# Patient Record
Sex: Female | Born: 1940 | Race: White | Hispanic: No | Marital: Married | Smoking: Never smoker
Health system: Southern US, Community
[De-identification: ages and names within clinical notes are randomized; demographics above are authoritative.]

## PROBLEM LIST (undated history)

## (undated) DIAGNOSIS — C541 Malignant neoplasm of endometrium: Secondary | ICD-10-CM

## (undated) DIAGNOSIS — E785 Hyperlipidemia, unspecified: Secondary | ICD-10-CM

## (undated) DIAGNOSIS — I10 Essential (primary) hypertension: Secondary | ICD-10-CM

## (undated) HISTORY — DX: Malignant neoplasm of endometrium: C54.1

## (undated) HISTORY — DX: Essential (primary) hypertension: I10

## (undated) HISTORY — DX: Hyperlipidemia, unspecified: E78.5

---

## 2008-04-10 HISTORY — PX: CHOLECYSTECTOMY: SHX55

## 2010-06-21 HISTORY — PX: ENDOMETRIAL BIOPSY: SHX622

## 2010-07-14 ENCOUNTER — Ambulatory Visit: Payer: Medicare Other | Attending: Gynecologic Oncology | Admitting: Gynecologic Oncology

## 2010-07-14 DIAGNOSIS — C549 Malignant neoplasm of corpus uteri, unspecified: Secondary | ICD-10-CM | POA: Insufficient documentation

## 2010-07-14 DIAGNOSIS — I10 Essential (primary) hypertension: Secondary | ICD-10-CM | POA: Insufficient documentation

## 2010-07-14 DIAGNOSIS — Z79899 Other long term (current) drug therapy: Secondary | ICD-10-CM | POA: Insufficient documentation

## 2010-07-14 DIAGNOSIS — Z809 Family history of malignant neoplasm, unspecified: Secondary | ICD-10-CM | POA: Insufficient documentation

## 2010-07-14 DIAGNOSIS — E785 Hyperlipidemia, unspecified: Secondary | ICD-10-CM | POA: Insufficient documentation

## 2010-07-19 NOTE — Consult Note (Signed)
[name], [name]                 ACCOUNT NO.:  [id number]  MEDICAL RECORD NO.:  [id number]           PATIENT TYPE:  LOCATION:                                 FACILITY:  PHYSICIAN:  Laurette Schimke, MD          DATE OF BIRTH:  DATE OF CONSULTATION:  07/14/2010 DATE OF DISCHARGE:                                CONSULTATION   Visit #[id number].  REFERRING PHYSICIAN:  Dr. Thamas Jaegers  REASON FOR VISIT:  Management of a grade 1 endometrial cancer.  HISTORY OF PRESENT ILLNESS:  This is a 70 year old gravida 4, para 3, last normal menstrual period at the age of 32.  She reports vaginal bleeding little over 3 weeks ago.  She was evaluated and an endometrial biopsy was collected on June 21, 2010, which demonstrated a well- differentiated endometrial adenocarcinoma, endometrioid type.  She states that since the procedure she has had continued bleeding.  PAST MEDICAL HISTORY: 1. Hypertension for 3 years. 2. Hyperlipidemia.  PAST SURGICAL HISTORY:  Cholecystectomy 2 years ago.  GYNECOLOGIC HISTORY:  Regular menses, never used any agents for birth control.  No history of abnormal Pap test, last Pap test in March 2012 within normal limits.  FAMILY HISTORY:  Notable for sister with an unknown malignancy who was diagnosed in her 75s, with death within a few weeks.  SCREENING HISTORY:  Colonoscopy 3 years ago within normal limits. Mammogram 2 weeks ago within normal limits.  SOCIAL HISTORY:  She denies tobacco use.  She reports occasional alcohol use.  Her children are alive and well.  She has been married for 55 years.  Her husband was recently diagnosed with early stage colon cancer.  REVIEW OF SYSTEMS:  Vaginal bleeding.  No hematuria.  No hematochezia. Bilateral intermittent pelvic pain, constipation.  No fever, chills, chest pain, shortness of breath, or weight loss.  ALLERGIES:  No known drug allergies.  MEDICATIONS: 1. Alprazolam 0.5 mg daily. 2. Lisinopril and  hydrochlorothiazide 20/12.5 mg daily. 3. Aspirin 81 mg daily. 4. Lactulose 10 mg daily. 5. Calcium 600 mg daily. 6. Pravastatin 20 mg daily.  PHYSICAL EXAMINATION:  GENERAL:  Well-developed, pleasant female, in no acute distress. VITAL SIGNS:  Weight 197 pounds, height 5 feet 3 inches, blood pressure 120/60, pulse 68, BMI of 35. CHEST:  Clear to auscultation. HEART:  Regular rate and rhythm. ABDOMEN:  Soft, obese, nontender.  No palpable masses. BACK:  No CVA tenderness. LYMPH NODE SURVEY:  No cervical, supraclavicular, or inguinal adenopathy. EXTREMITIES:  No clubbing, cyanosis, or edema. PELVIC:  Normal external genitalia, Bartholin, urethra, and Skene. Blood present in the vaginal vault, small cervix roughly 2.5 cm, no nodularity, no parametrial infiltration. RECTAL:  Good anal sphincter tone without any masses.  IMPRESSION:  [name] is a 70 year old with of a grade 1 endometrial adenocarcinoma of recent diagnosis.  The options discussed with the patient were that of either minimally invasive or exploratory laparotomy for endometrial cancer staging.  She was also made aware of other interventions that could abate symptoms, but warrant definitive treatment management.  The patient opted for a minimally invasive  approach for management of her endometrial cancer.  The proposed procedure is scheduled for Aug 29, 2009.  At that time, there is proposed robotic hysterectomy, bilateral salpingo-oophorectomy, pelvic lymph node dissection.  Risks and benefits of the procedure were discussed with the patient and her husband and daughter.  All their questions were answered to their satisfaction.  She was advised to discontinue the low-dose aspirin 10 days prior to surgery.     Laurette Schimke, MD     WB/MEDQ  D:  07/14/2010  T:  07/15/2010  Job:  [id number]  cc:   Dr. Jolene Schimke, R.N. [address] [place], [place] [address]  Dr. Renae Fickle  Electronically Signed  by Laurette Schimke MD on 07/19/2010 07:08:42 AM

## 2010-07-25 ENCOUNTER — Other Ambulatory Visit: Payer: Self-pay | Admitting: Gynecologic Oncology

## 2010-08-05 ENCOUNTER — Other Ambulatory Visit: Payer: Self-pay | Admitting: Obstetrics & Gynecology

## 2010-08-05 ENCOUNTER — Other Ambulatory Visit: Payer: Self-pay | Admitting: Gynecologic Oncology

## 2010-08-05 ENCOUNTER — Ambulatory Visit (HOSPITAL_COMMUNITY)
Admission: RE | Admit: 2010-08-05 | Discharge: 2010-08-05 | Disposition: A | Discharge status: Home / Self Care | Payer: Medicare Other | Source: Ambulatory Visit | Attending: Obstetrics & Gynecology | Admitting: Obstetrics & Gynecology

## 2010-08-05 ENCOUNTER — Encounter (HOSPITAL_COMMUNITY): Payer: Medicare Other

## 2010-08-05 DIAGNOSIS — I10 Essential (primary) hypertension: Secondary | ICD-10-CM | POA: Insufficient documentation

## 2010-08-05 DIAGNOSIS — Z90710 Acquired absence of both cervix and uterus: Secondary | ICD-10-CM | POA: Insufficient documentation

## 2010-08-05 DIAGNOSIS — Z79899 Other long term (current) drug therapy: Secondary | ICD-10-CM | POA: Insufficient documentation

## 2010-08-05 DIAGNOSIS — C541 Malignant neoplasm of endometrium: Secondary | ICD-10-CM

## 2010-08-05 DIAGNOSIS — Z01818 Encounter for other preprocedural examination: Secondary | ICD-10-CM | POA: Insufficient documentation

## 2010-08-05 DIAGNOSIS — Z01810 Encounter for preprocedural cardiovascular examination: Secondary | ICD-10-CM | POA: Insufficient documentation

## 2010-08-05 DIAGNOSIS — Z01812 Encounter for preprocedural laboratory examination: Secondary | ICD-10-CM | POA: Insufficient documentation

## 2010-08-05 DIAGNOSIS — C549 Malignant neoplasm of corpus uteri, unspecified: Secondary | ICD-10-CM | POA: Insufficient documentation

## 2010-08-05 LAB — COMPREHENSIVE METABOLIC PANEL
ALT: 17 U/L (ref 0–35)
AST: 19 U/L (ref 0–37)
Albumin: 3.6 g/dL (ref 3.5–5.2)
Calcium: 9.3 mg/dL (ref 8.4–10.5)
Chloride: 107 mEq/L (ref 96–112)
Creatinine, Ser: 1.22 mg/dL — ABNORMAL HIGH (ref 0.4–1.2)
GFR calc Af Amer: 53 mL/min — ABNORMAL LOW (ref >60)
Sodium: 140 mEq/L (ref 135–145)
Total Bilirubin: 0.6 mg/dL (ref 0.3–1.2)

## 2010-08-05 LAB — DIFFERENTIAL
Basophils Absolute: 0 10*3/uL (ref 0.0–0.1)
Basophils Relative: 0 % (ref 0–1)
Eosinophils Absolute: 0.3 10*3/uL (ref 0.0–0.7)
Monocytes Relative: 9 % (ref 3–12)
Neutro Abs: 3.4 10*3/uL (ref 1.7–7.7)
Neutrophils Relative %: 59 % (ref 43–77)

## 2010-08-05 LAB — CBC
MCH: 29.3 pg (ref 26.0–34.0)
Platelets: 230 10*3/uL (ref 150–400)
RBC: 4.41 MIL/uL (ref 3.87–5.11)

## 2010-08-09 DIAGNOSIS — C541 Malignant neoplasm of endometrium: Secondary | ICD-10-CM

## 2010-08-09 HISTORY — DX: Malignant neoplasm of endometrium: C54.1

## 2010-08-09 HISTORY — PX: OTHER SURGICAL HISTORY: SHX169

## 2010-08-16 ENCOUNTER — Ambulatory Visit (HOSPITAL_COMMUNITY)
Admission: RE | Admit: 2010-08-16 | Discharge: 2010-08-17 | Disposition: A | Discharge status: Home / Self Care | Payer: Medicare Other | Source: Ambulatory Visit | Attending: Obstetrics & Gynecology | Admitting: Obstetrics & Gynecology

## 2010-08-16 ENCOUNTER — Other Ambulatory Visit: Payer: Self-pay | Admitting: Gynecologic Oncology

## 2010-08-16 DIAGNOSIS — Z79899 Other long term (current) drug therapy: Secondary | ICD-10-CM | POA: Insufficient documentation

## 2010-08-16 DIAGNOSIS — C549 Malignant neoplasm of corpus uteri, unspecified: Secondary | ICD-10-CM | POA: Insufficient documentation

## 2010-08-16 DIAGNOSIS — Z01812 Encounter for preprocedural laboratory examination: Secondary | ICD-10-CM | POA: Insufficient documentation

## 2010-08-16 DIAGNOSIS — R7309 Other abnormal glucose: Secondary | ICD-10-CM | POA: Insufficient documentation

## 2010-08-16 DIAGNOSIS — I10 Essential (primary) hypertension: Secondary | ICD-10-CM | POA: Insufficient documentation

## 2010-08-16 HISTORY — PX: ABDOMINAL HYSTERECTOMY: SHX81

## 2010-08-16 LAB — ABO/RH: ABO/RH(D): O POS

## 2010-08-16 LAB — TYPE AND SCREEN

## 2010-08-17 LAB — BASIC METABOLIC PANEL
CO2: 24 mEq/L (ref 19–32)
Calcium: 8.7 mg/dL (ref 8.4–10.5)
GFR calc Af Amer: >60 mL/min (ref >60)
Sodium: 134 mEq/L — ABNORMAL LOW (ref 135–145)

## 2010-08-17 LAB — CBC
Hemoglobin: 10.3 g/dL — ABNORMAL LOW (ref 12.0–15.0)
MCHC: 33.3 g/dL (ref 30.0–36.0)
RBC: 3.51 MIL/uL — ABNORMAL LOW (ref 3.87–5.11)

## 2010-08-17 LAB — HEMOGLOBIN A1C: Mean Plasma Glucose: 111 mg/dL (ref <117)

## 2010-08-25 NOTE — Op Note (Signed)
[name], [name]                 ACCOUNT NO.:  [id number]  MEDICAL RECORD NO.:  [id number]           PATIENT TYPE:  O  LOCATION:  1531                         FACILITY:  [facility]  PHYSICIAN:  Nikiah Goin A. Duard Brady, MD    DATE OF BIRTH:  Aug 01, 1940  DATE OF PROCEDURE:  08/16/2010 DATE OF DISCHARGE:                              OPERATIVE REPORT   PREOPERATIVE DIAGNOSIS:  Grade 1 endometrioid adenocarcinoma.  POSTOPERATIVE DIAGNOSIS:  Grade 1 endometrioid adenocarcinoma.  PROCEDURE:  Total robotic hysterectomy, bilateral salpingo-oophorectomy, bilateral pelvic right para-aortic lymph node dissection.  SURGEON: 1. Andreika Vandagriff A. Duard Brady, MD 2. Roseanna [place], M.D.  ASSISTANT:  Telford Nab, R.N.  ANESTHESIA:  General.  ANESTHESIOLOGIST:  Jill Side, M.D.  BLOOD LOSS:  50 mL.  IV FLUIDS:  1500 mL.  URINE OUTPUT:  200 mL.  SPECIMENS:  Washings, cervix, uterus, bilateral tubes and ovaries, bilateral pelvic lymph nodes, right-sided para-aortic lymph nodes to pathology.  COMPLICATIONS:  None.  OPERATIVE FINDINGS:  Included a globular uterus approximately 8-week sized.  Bilaterally there were enlarged right external iliac and left external iliac nodes.  Frozen section showed a grade 1 lesion with greater than 50% myometrial invasion.  DESCRIPTION OF PROCEDURE:  The patient was identified in holding area as self.  Informed consent was signed on the chart.  Risks and benefits of the procedure were discussed with the patient and she wished to proceed. She was then taken to the operating room, placed in supine position where arms were tucked at her sides with all appropriate precautions and Gelfoam.  General anesthesia was then induced.  She was then placed in dorsal lithotomy position with SCDs.  Shoulder blocks were placed in appropriate fashion.  OG tube was placed and placed to suction.  The perineum was cleansed with Betadine cleaning solution.  Time-out  was performed to confirm the patient, the procedure, antibiotic, and allergy status.  Foley catheter was inserted with the bladder.  Sterile speculum was inserted into the vagina.  The cervix was grasped with single-tooth tenaculum.  The endocervical canal was dilated.  The ZUMI with the medium ring was placed without difficulty.  The abdomen was then prepped in usual fashion with ChloraPrep.  After waiting for dry 3 minutes, the patient was then draped.  Time-out was performed again to confirm the procedure, the patient, antibiotic and allergy status.  After again confirming that the OG tube was placed into suction.  A 1 cc of local 0.25% Marcaine was injected in the midclavicular line 2 cm below the costal margin on the left hand side.  Incision was made using the 5-mm Optiview.  Intra-abdominal placement was confirmed with the port.  The abdomen was insufflated with CO2 gas.  At this point and all points during the case, the patient's intra-abdominal pressure did not increase over 50 mmHg.  She was then placed in deep Trendelenburg position which she tolerated quite well.  A 10/12 port was placed 23 cm above the pubic symphysis.  Bilateral 8-mm ports were placed at 10 cm width at a 50- degree angle inferior to this under direct visualization.  The 5-mm port was then under direct visualization converted to a 10/12.  The small bowel was folded upon itself on the mesentery.  The robot was undocked. Abdominal pelvic washings were obtained.  The round ligament on the right side was transected with monopolar cautery and the anterior and posterior leaves of broad ligament were opened.  The ureter was identified.  A window was made between the IP and the ureter.  The IP was coagulated and transected.  The uterine vessels were then skeletonized and the bladder flap was created.  The uterine vessels were then coagulated with bipolar cautery and transected and a C-loop was created.  Similar  procedure was performed on the patient's left side after confirming that the bladder flap was adequate.  The pneumo- occluder balloon was insufflated.  The colpotomy was performed.  The specimen was delivered through the vagina without difficulty.  The pneumo-occluder balloon was replaced.  Attention was then drawn to the right pelvic sidewall.  The paravesical and pararectal spaces were opened.  A cystic grasper was used to hold the pararectal space, opened in the nodal bundle, was taken down from the inferior aspect of the common iliac artery on the patient's right side down to the circumflex iliac vein.  The paravesical space was then held open.  The obturator nerve was identified.  The nodal bundle superior to the obturator nerve was taken down.  The genitofemoral and obturator nerves were visualized at all times and spared.  The ureter was well medial from the ovary dissection.  The area was hemostatic. The right pelvic lymph nodes were then placed in EndoCatch bag with no knot.  Similar procedure was performed on the patient's left side.  The only difference was that there was an enlarged approximately 2 x 2 cm lymph node that was splaying the external iliac artery and vein on the left side from each other.  Similarly, this nodal bundle was placed in EndoCatch bag with 2 knots.  Our attention was then drawn to the para- aortic region.  Using monopolar cautery, an incision was made over the common iliac artery on the patient's right side.  The ureter was identified and held away.  The nodal bundle extending to the right side of the aorta over the vena cava was taken down with great care using pinpoint cautery.  This dissection was performed to the reflection of the duodenum.  These nodal bundle was then delivered through the 10/12 port.  Due to the patient's habitus and intra-abdominal obesity, we could not perform the left-sided para-aortic lymph nodes.  All specimens included  Ray-Tec and 2 EndoCatch bags with nodes were delivered through the vagina.  The pneumo-occluder balloon was replaced.  The vagina was closed with a running suture of 0 Vicryl on a CT-1 in a running fashion. The needle was removed without difficulty.  The abdomen and pelvis were copiously irrigated.  All pedicles were removed, hemostatic under low flow.  The trocars were then all removed as were the instruments under direct visualization.  The robot was then undocked.  The pneumoperitoneum was removed.  The fascia was closed in a figure-of- eight fashion at the umbilicus.  A deep stitch was placed in 10/12 port in the left upper quadrant.  Skin was closed using 4-0 Vicryl.  Steri-Strips and benzoin were applied.  The shoulder blocks were removed, the skin was atraumatic.  The vagina was swabbed and noted be hemostatic.  The patient tolerated the procedure well.  All instrument, needle,  and Ray-Tec counts were correct x2.     Faiza Bansal A. Duard Brady, MD     PAG/MEDQ  D:  08/16/2010  T:  08/17/2010  Job:  [id number]  cc:   Telford Nab, R.N. [address] [place], [place] [address]  Roseanna [place], M.D. Fax: [phone]  Renae Fickle Fax: [phone]  Electronically Signed by Cleda Mccreedy MD on 08/25/2010 01:52:04 PM

## 2010-09-07 ENCOUNTER — Ambulatory Visit: Payer: Medicare Other | Attending: Gynecologic Oncology | Admitting: Gynecologic Oncology

## 2010-09-07 DIAGNOSIS — Z90710 Acquired absence of both cervix and uterus: Secondary | ICD-10-CM | POA: Insufficient documentation

## 2010-09-07 DIAGNOSIS — C549 Malignant neoplasm of corpus uteri, unspecified: Secondary | ICD-10-CM | POA: Insufficient documentation

## 2010-09-07 DIAGNOSIS — Z9079 Acquired absence of other genital organ(s): Secondary | ICD-10-CM | POA: Insufficient documentation

## 2010-09-08 NOTE — Consult Note (Signed)
NAMESAMANTHAN, DUGO                 ACCOUNT NO.:  [id number]  MEDICAL RECORD NO.:  [id number]           PATIENT TYPE:  O  LOCATION:  GYN                          FACILITY[facility]  PHYSICIAN:  Nicolle Heward A. Duard Brady, MD    DATE OF BIRTH:  1940/08/18  DATE OF CONSULTATION:  09/07/2010 DATE OF DISCHARGE:                                CONSULTATION   HISTORY OF PRESENT ILLNESS:  Ms. Messing is a 70 year old who was evaluated and seen by Dr. Nelly Rout for a grade 1 endometrial carcinoma on July 14, 2010.  She subsequently underwent surgery on Aug 16, 2010, and underwent a robotic hysterectomy, BSO, bilateral pelvic and right paraortic lymph node dissection.  Operative findings included a globular uterus, approximately 8 weeks' size.  Bilaterally, there were enlarged right external iliac and left external iliac lymph nodes.  Frozen section showed a grade 1 lesion with greater than 50% myometrial invasion.  Final pathology revealed a grade 1 endometrioid adenocarcinoma with lymphovascular space involvement.  The tumor invaded 1.8 cm where the myometrium was 2.4 cm in thickness.  The maximum tumor size was 6 cm.  2 out of 7 lymph nodes were positive including 1 out of 4 right pelvic, 1 out of 2 left pelvic, and 0 out of 1 right paraaortic. The patient comes in with her family today for a brief postop check, but primarily to discuss pathology.  She is overall doing fairly well from surgery, has not required any pain medication.  She does have some constipation, but that predates surgery.  PAST SURGICAL HISTORY:  She does complain of some fluid leaking, she is not sure if it is urine or from the vagina.  She has had no bleeding, no abdominal pain.  PHYSICAL EXAMINATION:  VITAL SIGNS:  Weight 195 pounds, which is stable. Blood pressure 140/72, pulse 62, respirations 20, temperature 98. GENERAL:  A well-nourished, well-developed female in no acute distress. ABDOMEN:  Well-healed robotic port sites.   There is slight erythema around the left lower quadrant port site, but there is no serious infection.  There appears to be irritation from a stitch.  Abdomen is otherwise soft and nontender. PELVIC:  External genitalia is within normal limits.  There is some wetness on the vulva and the hair is wet.  The vagina is atrophic.  The vaginal cuff is visualized.  There is no pooling.  There is no fluid in the vagina.  Bimanual examination reveals no masses, nodularity, or tenderness.  ASSESSMENT:  A 70 year old with IIIC1 endometrial carcinoma.  She is doing fairly well from surgery.  I believe the fluid leakage that she is having is actually urine and not coming from the vagina.  However, we will continue to monitor this.  She has stage IIIC1 endometrial carcinoma based on positive lymphadenopathy.  I reviewed with her and her family that with stage IIIC overall the risk of recurrence is quite high and a 5-year survival is about 30% to 40%.  However, if you look at just the subset of patients with IIIC1 disease, they tend to do a little bit better and with postoperative  therapy survival can be upwards 70% to 80%.  I discussed with them the 4 options, which include no further therapy, radiation, chemotherapy, or a combination of the above.  I would recommend chemotherapy in a sandwich fashion with radiation, which would be 3 cycles of paclitaxel and carboplatin, followed by pelvic radiation, followed by an additional 3 cycles of paclitaxel and carboplatin.  They would very much like to receive this in [place], I discussed with them that we have a very good relationship with Medical [facility]cology as well as Radiation Oncology in [place] and that we would be happy to help them arrange this.  The patient really has not asked any questions and has really left all the discussion up to her daughter and husband.  She is very tearful and seems to be overwhelmed.  We discussed with them that they do  not have to make a decision today.  The chemotherapy and radiation recommendations were written down for them, they do have our cards and they can call us back with any questions.  I also discussed with them that we would not be starting for another 3 weeks, so she would have time to make a decision and a timeline that works for her. In addition, if she started treatment and it was much worse than what she had anticipated, we would always be able to cease treatment and she seemed to understand this.  Their questions were elicited and answered to their satisfaction.  We will wait for their call to go over any other questions that they have or if they were able to make a decision, to act upon that.  Twenty minute face-to-face time independent of the global postoperative treatment were spent in discussing pathology and options.     Kalysta Kneisley A. Duard Brady, MD     PAG/MEDQ  D:  09/07/2010  T:  09/08/2010  Job:  [id number]  cc:   Telford Nab, R.N. [address] [place], [place] [address]  Renae Fickle, MD Fax: [phone]  Electronically Signed by Cleda Mccreedy MD on 09/08/2010 09:46:49 AM

## 2010-10-06 ENCOUNTER — Ambulatory Visit: Payer: Medicare Other | Attending: Gynecologic Oncology | Admitting: Gynecologic Oncology

## 2010-10-06 DIAGNOSIS — I10 Essential (primary) hypertension: Secondary | ICD-10-CM | POA: Insufficient documentation

## 2010-10-06 DIAGNOSIS — E785 Hyperlipidemia, unspecified: Secondary | ICD-10-CM | POA: Insufficient documentation

## 2010-10-06 DIAGNOSIS — C549 Malignant neoplasm of corpus uteri, unspecified: Secondary | ICD-10-CM | POA: Insufficient documentation

## 2010-10-07 NOTE — Consult Note (Signed)
  [name], [name]                 ACCOUNT NO.:  [id number]  MEDICAL RECORD NO.:  [id number]  LOCATION:  GYN                          [facility]ACILITY[facility]  PHYSICIAN:  Kristen Schimke, MD     DATE OF BIRTH:  1940-05-28  DATE OF CONSULTATION:  10/06/2010 DATE OF DISCHARGE:                                CONSULTATION   REASON FOR VISIT:  Postoperative check.  HISTORY OF PRESENT ILLNESS:  This is a 70 year old diagnosed by Dr. Oliva Taylor with a grade 1 endometrial carcinoma.  On Aug 16, 2010, she underwent a robotic hysterectomy, BSO, bilateral pelvic and paraaortic lymph node dissection.  Final pathologic findings were notable for metastatic disease and bilateral pelvic lymph nodes.  No paraaortic metastatic adenopathy was appreciated.  Tumor was noted to extend 1.8 cm where the myometrium was 2.4 cm and there was lymphovascular space involvement.  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  [name] at this visit denies any vaginal bleeding.  Denies abdominal pain, nausea, vomiting, fever, chills.  PAST MEDICAL HISTORY:  Hypertension for 3 years, hyperlipidemia, stage IIIC1 grade 1 endometrial adenocarcinoma.  PAST SURGICAL HISTORY:  Cholecystectomy 2 years ago, endometrial cancer staging in May 2012.  FAMILY HISTORY:  No interval changes.  SOCIAL HISTORY:  No interval changes.  REVIEW OF SYSTEMS:  Denies vaginal bleeding.  No nausea, vomiting, hematuria.  Normal bowel movements.  No swelling of the lower extremities.  No abdominal pain.  PHYSICAL EXAMINATION:  GENERAL:  Well-developed female, in no acute distress. VITAL SIGNS:  Blood pressure 120/70, pulse 72, temperature 98.5. CHEST:  Clear to auscultation. HEART:  Regular rate and rhythm. ABDOMEN:  Soft, nontender.  Laparoscopic port sites within normal limits without any evidence of a hernia.  No palpable abdominal masses. PELVIC:  On pelvic examination,  small amount of blood is noted at the left vaginal apex.  Silver nitrate was placed.  On digital vaginal examination, no cul-de-sac tenderness, nodularity or fullness is appreciated.  IMPRESSION:  Stage IIIC1 grade 1 endometrial adenocarcinoma.  RECOMMENDATIONS:  Recommendation has been made for chemoradiation to be delivered in a sandwich fashion.  She has seen Dr. Clovis Taylor and Dr. Gilman Taylor  for evaluation.  All of her questions were answered to her satisfaction  and we will wait followup visit as scheduled by these physicians.     Kristen Schimke, MD     WB/MEDQ  D:  10/06/2010  T:  10/06/2010  Job:  [id number]  cc:   Renae Fickle Fax: [phone]  [name], R.N. [phone] [address] [place], [place] [address]  [name] Fax: [phone]  Kristen Kristen Buttner, MD [facility]  Kristen Payor, MD [facility] Electronically Signed by Kristen Schimke MD on 10/07/2010 08:30:17 AM

## 2011-05-31 ENCOUNTER — Encounter: Payer: Self-pay | Admitting: Gynecologic Oncology

## 2011-06-01 ENCOUNTER — Ambulatory Visit: Payer: Medicare Other | Attending: Gynecologic Oncology | Admitting: Gynecologic Oncology

## 2011-06-01 ENCOUNTER — Other Ambulatory Visit (HOSPITAL_COMMUNITY)
Admission: RE | Admit: 2011-06-01 | Discharge: 2011-06-01 | Disposition: A | Discharge status: Home / Self Care | Payer: Medicare Other | Source: Ambulatory Visit | Attending: Gynecologic Oncology | Admitting: Gynecologic Oncology

## 2011-06-01 ENCOUNTER — Encounter: Payer: Self-pay | Admitting: Gynecologic Oncology

## 2011-06-01 VITALS — BP 130/60 | HR 80 | Temp 98.2°F | Resp 16 | Ht 62.6 in | Wt 179.7 lb

## 2011-06-01 DIAGNOSIS — C541 Malignant neoplasm of endometrium: Secondary | ICD-10-CM

## 2011-06-01 DIAGNOSIS — I10 Essential (primary) hypertension: Secondary | ICD-10-CM | POA: Insufficient documentation

## 2011-06-01 DIAGNOSIS — C549 Malignant neoplasm of corpus uteri, unspecified: Secondary | ICD-10-CM | POA: Insufficient documentation

## 2011-06-01 DIAGNOSIS — Z90710 Acquired absence of both cervix and uterus: Secondary | ICD-10-CM | POA: Insufficient documentation

## 2011-06-01 DIAGNOSIS — Z7982 Long term (current) use of aspirin: Secondary | ICD-10-CM | POA: Insufficient documentation

## 2011-06-01 DIAGNOSIS — Z9089 Acquired absence of other organs: Secondary | ICD-10-CM | POA: Insufficient documentation

## 2011-06-01 DIAGNOSIS — Z124 Encounter for screening for malignant neoplasm of cervix: Secondary | ICD-10-CM | POA: Insufficient documentation

## 2011-06-01 DIAGNOSIS — E785 Hyperlipidemia, unspecified: Secondary | ICD-10-CM | POA: Insufficient documentation

## 2011-06-01 DIAGNOSIS — Z9221 Personal history of antineoplastic chemotherapy: Secondary | ICD-10-CM | POA: Insufficient documentation

## 2011-06-01 DIAGNOSIS — Z923 Personal history of irradiation: Secondary | ICD-10-CM | POA: Insufficient documentation

## 2011-06-01 NOTE — Progress Notes (Signed)
Consult Note: Gyn-Onc  [name] 71 y.o. female  CC:  Chief Complaint  Patient presents with  . Endometrial cancer    Follow up    HPI: HISTORY OF PRESENT ILLNESS: This is a 71 year old diagnosed by Dr.  Oliva Bustard with a grade 1 endometrial carcinoma. On Aug 16, 2010, she underwent a robotic hysterectomy, BSO, bilateral pelvic and paraaortic  lymph node dissection. Final pathologic findings were notable for  metastatic disease and bilateral pelvic lymph nodes. No paraaortic  metastatic adenopathy was appreciated. Tumor was noted to extend 1.8 cm  where the myometrium was 2.4 cm and there was lymphovascular space  involvement. 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  Because of her pathology she underwent chemotherapy with 3 cycles of paclitaxel and carboplatin followed by pelvic radiation in an additional 3 cycles of paclitaxel and carboplatin. Her last cycle of chemotherapy was 03/27/2011. She was hospitalized with neutropenia and anemia on your cycles #2 or 3 and then cycle #4. She received a blood transfusion with one of her chemotherapy cycles. She had a CT scan of the abdomen and pelvis on January 14 for posttreatment surveillance. There was a small less than 1 cm fluid cyst posterior right hepatic lobe. There is no liver masses. There was no pancreatic, spleen, adrenal, and kidney disease. There was no evidence of hydronephrosis. There were 2 retroperitoneal lymph nodes seen in the left para-aortic space just below level of the left renal vein the largest being 10 mm in short axis. There was no other evidence of any concerning for metastatic disease. She overall feels that she tolerated her treatments rather well. Dr. Gilman Buttner did speak to them regarding her CT scan. The plan was for her to see Dr. Gilman Buttner on February 28 with plan for repeat scan in 3 months after this January scan.   Interval History:   Review of  Systems [name] at this visit denies any vaginal bleeding. Denies abdominal pain, nausea, vomiting, fever, chills. 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 She had some neuropathy her hands but that has subsequently resolved. She is complaining of her poor been tender since her CT scan. She states she feels that there is a "needle stuck in her pot. She denies any chest pain, SOB,  Headaches, unintentional weight, loss weight gain, abdominal pain, or significant change of bowel bladder habits. 10 point review of systems is negative.  Current Meds:  Outpatient Encounter Prescriptions as of 06/01/2011  Medication Sig Dispense Refill  . ALPRAZolam (XANAX) 0.5 MG tablet Take 0.5 mg by mouth daily.      Marland Kitchen aspirin 81 MG tablet Take 81 mg by mouth daily.      . calcium carbonate (OS-CAL) 600 MG TABS Take 600 mg by mouth daily.      Marland Kitchen LACTULOSE PO Take 10 mg by mouth daily.      Marland Kitchen lisinopril-hydrochlorothiazide (PRINZIDE,ZESTORETIC) 20-12.5 MG per tablet Take 1 tablet by mouth daily.      . pravastatin (PRAVACHOL) 20 MG tablet Take 20 mg by mouth daily.      Marland Kitchen senna-docusate (SENOKOT-S) 8.6-50 MG per tablet Take 1 tablet by mouth as needed.        Allergy: No Known Allergies  Social Hx:   History   Social History  . Marital Status: Married    Spouse Name: N/A    Number of Children: N/A  . Years of Education: N/A   Occupational  History  . Not on file.   Social History Main Topics  . Smoking status: Never Smoker   . Smokeless tobacco: Not on file  . Alcohol Use: Yes     Occas.  . Drug Use: Not on file  . Sexually Active: Not on file   Other Topics Concern  . Not on file   Social History Narrative  . No narrative on file    Past Surgical Hx:  Past Surgical History  Procedure Date  . Cholecystectomy 2010  . Endometrial biopsy 06/21/10  . Endometrial cancer staging 08/2010    Past Medical Hx:  Past Medical History  Diagnosis Date  . Hypertension   . Hyperlipidemia   .  Endometrial adenocarcinoma 08/2010    Stage IIIC1 grade 1    Family Hx: No family history on file.  Vitals:  Blood pressure 130/60, pulse 80, temperature 98.2 F (36.8 C), resp. rate 16, height 5' 2.6" (1.59 m), weight 179 lb 11.2 oz (81.511 kg).  Physical Exam:  Well-nourished well-developed female in no acute distress.  Neck: Supple no lymphadenopathy no thyromegaly.  Lungs: Clear to auscultation bilaterally. Cardiovascular: Regular rate and rhythm. Port is on the left side. Port is atraumatic and it is palpably normal.  Abdomen: Mildly obese. Soft, nontender, nondistended. There is no evidence of any incisional hernias. There are no palpable masses. Exam is limited by habitus.  Groins: No lymphadenopathy.  Extremities: Trace pedal edema equal bilaterally. 2+ pulses.  Pelvic: Normal external female genitalia. Vagina is markedly atrophic. The vaginal cuff is visualized. There are no visible lesions. ThinPrep Pap was submitted without difficulty. Bimanual examination reveals no masses or nodularity rectal confirms. Assessment/Plan: 71 year old with stage IIIc 1 endometrial carcinoma diagnosed in May of 2012 was been adequately treated with surgery, chemotherapy, and radiation. A posttreatment CT scan January 14 shows 2 retroperitoneal lymph nodes  just below the level of the renal vein the largest being 10 mm in short axis. At this point my suspicion for this is low however she does require close followup and that has also been scheduled by Dr. Gilman Buttner. She'll followup with Dr. Gilman Buttner on February 28 as scheduled. She will then have a CT scan in 3 months. She'll need to return to see [place] in 6 months. She was given a appointment card. We will followup in results of your Pap smear from today communicate those results with the patient. [name] A., MD 06/01/2011, 1:11 PM

## 2011-06-01 NOTE — Patient Instructions (Signed)
We will call you with the results of your Pap smear. Please followup as scheduled with Dr. Gilman Buttner.

## 2011-06-15 ENCOUNTER — Telehealth: Payer: Self-pay | Admitting: Gynecologic Oncology

## 2011-06-15 NOTE — Telephone Encounter (Signed)
Pt notified about pap results: negative.  No questions or concerns voiced. 

## 2011-11-29 ENCOUNTER — Ambulatory Visit: Payer: Medicare Other | Attending: Gynecologic Oncology | Admitting: Gynecologic Oncology

## 2011-11-29 ENCOUNTER — Encounter: Payer: Self-pay | Admitting: Gynecologic Oncology

## 2011-11-29 ENCOUNTER — Other Ambulatory Visit (HOSPITAL_COMMUNITY)
Admission: RE | Admit: 2011-11-29 | Discharge: 2011-11-29 | Disposition: A | Discharge status: Home / Self Care | Payer: Medicare Other | Source: Ambulatory Visit | Attending: Gynecologic Oncology | Admitting: Gynecologic Oncology

## 2011-11-29 VITALS — BP 110/68 | HR 78 | Temp 98.1°F | Resp 16 | Ht 62.6 in | Wt 188.0 lb

## 2011-11-29 DIAGNOSIS — Z9221 Personal history of antineoplastic chemotherapy: Secondary | ICD-10-CM | POA: Insufficient documentation

## 2011-11-29 DIAGNOSIS — Z9079 Acquired absence of other genital organ(s): Secondary | ICD-10-CM | POA: Insufficient documentation

## 2011-11-29 DIAGNOSIS — Z923 Personal history of irradiation: Secondary | ICD-10-CM | POA: Insufficient documentation

## 2011-11-29 DIAGNOSIS — Z90710 Acquired absence of both cervix and uterus: Secondary | ICD-10-CM | POA: Insufficient documentation

## 2011-11-29 DIAGNOSIS — Z124 Encounter for screening for malignant neoplasm of cervix: Secondary | ICD-10-CM | POA: Insufficient documentation

## 2011-11-29 DIAGNOSIS — Z79899 Other long term (current) drug therapy: Secondary | ICD-10-CM | POA: Insufficient documentation

## 2011-11-29 DIAGNOSIS — E785 Hyperlipidemia, unspecified: Secondary | ICD-10-CM | POA: Insufficient documentation

## 2011-11-29 DIAGNOSIS — I10 Essential (primary) hypertension: Secondary | ICD-10-CM | POA: Insufficient documentation

## 2011-11-29 DIAGNOSIS — C549 Malignant neoplasm of corpus uteri, unspecified: Secondary | ICD-10-CM | POA: Insufficient documentation

## 2011-11-29 DIAGNOSIS — C541 Malignant neoplasm of endometrium: Secondary | ICD-10-CM

## 2011-11-29 NOTE — Patient Instructions (Signed)
RTC 6 months

## 2011-11-29 NOTE — Progress Notes (Signed)
Consult Note: Gyn-Onc  [name] 71y.o. female  CC:  Chief Complaint  Patient presents with  . Endometrial cancer    Follow up    HPI: HISTORY OF PRESENT ILLNESS: This is a 71 year old diagnosed by Dr. Oliva Taylor with a grade 1 endometrial carcinoma. On Aug 16, 2010, she underwent a robotic hysterectomy, BSO, bilateral pelvic and paraaortic lymph node dissection. Final pathologic findings were notable for  metastatic disease and bilateral pelvic lymph nodes. No paraaortic  metastatic adenopathy was appreciated. Tumor was noted to extend 1.8 cm  where the myometrium was 2.4 cm and there was lymphovascular space  involvement. 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  Because of her pathology she underwent chemotherapy with 3 cycles of paclitaxel and carboplatin followed by pelvic radiation in an additional 3 cycles of paclitaxel and carboplatin. Her last cycle of chemotherapy was 03/27/2011. She was hospitalized with neutropenia and anemia on your cycles #2 or 3 and then cycle #4. She received a blood transfusion with one of her chemotherapy cycles. She had a CT scan of the abdomen and pelvis on January 14 for posttreatment surveillance. There was a small less than 1 cm fluid cyst posterior right hepatic lobe. There is no liver masses. There was no pancreatic, spleen, adrenal, and kidney disease. There was no evidence of hydronephrosis. There were 2 retroperitoneal lymph nodes seen in the left para-aortic space just below level of the left renal vein the largest being 10 mm in short axis. There was no other evidence of any concerning for metastatic disease. She overall feels that she tolerated her treatments rather well. Dr. Gilman Buttner did speak to them regarding her CT scan. I last saw her in 2/13 and her exam and pap smear were both normal.  Interval History:  Since we last saw her she had a variety of tests done. Sound that she was  having some right-sided hip pain and has undergone steroid injections in the hip and the pain resolved itself. On July 22 she had a bone scan. Revealed focal increased activity in the right acetabulum corresponding to patchy sclerosis on the CT scan that was highly suspicious for metastatic disease. A CT scan was performed on July 16 and his bone scan was in July 22. Followup, she had an MRI of the pelvis on 11/06/2011. The MRI revealed marrow abnormalities involving the sacral ala bilaterally. The superior acetabulum right greater than left and the right proximal femur. 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 However, the underlying metastatic disease was difficult to completely exclude in short term followup with a bone scan or MRI after appropriate rest was suggested. She is scheduled to see an orthopedic surgeon tomorrow. There is no other change in the patient's medical history with the family history. She is up-to-date on her mammograms.  Review of Systems Ms. Pendley at this visit denies any vaginal bleeding. Denies abdominal pain, nausea, vomiting, fever, chills. 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 She had some neuropathy her hands but that has subsequently resolved. . She denies any chest pain, SOB,  Headaches, unintentional weight, loss weight gain, abdominal pain, or significant change of bowel bladder habits. She has gained about 9 pounds but states she's been eating too much. 10 point review of systems is negative.  Current Meds:          Outpatient Encounter Prescriptions as of 06/01/2011  Medication Sig Dispense Refill  . ALPRAZolam (XANAX) 0.5 MG tablet Take 0.5 mg by mouth daily.      Marland Kitchen aspirin 81 MG tablet Take 81 mg by mouth daily.      . calcium carbonate (OS-CAL) 600 MG TABS Take 600 mg by mouth daily.      Marland Kitchen LACTULOSE PO Take 10 mg by mouth daily.      Marland Kitchen lisinopril-hydrochlorothiazide  (PRINZIDE,ZESTORETIC) 20-12.5 MG per tablet Take 1 tablet by mouth daily.      . pravastatin (PRAVACHOL) 20 MG tablet Take 20 mg by mouth daily.      Marland Kitchen senna-docusate (SENOKOT-S) 8.6-50 MG per tablet Take 1 tablet by mouth as needed.        Allergy: No Known Allergies  Social Hx:   History   Social History  . Marital Status: Married    Spouse Name: N/A    Number of Children: N/A  . Years of Education: N/A   Occupational History  . Not on file.   Social History Main Topics  . Smoking status: Never Smoker   . Smokeless tobacco: Not on file  . Alcohol Use: Yes     Occas.  . Drug Use: Not on file  . Sexually Active: Not on file   Other Topics Concern  . Not on file   Social History Narrative  . No narrative on file    Past Surgical Hx:  Past Surgical History  Procedure Date  . Cholecystectomy 2010  . Endometrial biopsy 06/21/10  . Endometrial cancer staging 08/2010    Past Medical Hx:  Past Medical History  Diagnosis Date  . Hypertension   . Hyperlipidemia   . Endometrial adenocarcinoma 08/2010    Stage IIIC1 grade 1    Family Hx: No family history on file.  Vitals:  Blood pressure 130/60, pulse 80, temperature 98.2 F (36.8 C), resp. rate 16, height 5' 2.6" (1.59 m), weight 179 lb 11.2 oz (81.511 kg).  Physical Exam:  Well-nourished well-developed female in no acute distress.  Neck: Supple no lymphadenopathy no thyromegaly.  Lungs: Clear to auscultation bilaterally. Cardiovascular: Regular rate and rhythm. Port is on the left side. Port is atraumatic and it is palpably normal.  Abdomen: Mildly obese. Soft, nontender, nondistended. There is no evidence of any incisional hernias. There are no palpable masses. Exam is limited by habitus.  Groins: No lymphadenopathy.  Extremities: Trace pedal edema equal bilaterally. 2+ pulses.  Pelvic: Normal external female genitalia. Vagina is markedly atrophic. The vaginal cuff is visualized. There are no visible  lesions. ThinPrep Pap was submitted without difficulty. Bimanual examination reveals no masses or nodularity rectal confirms.  Assessment/Plan: 71 year old with stage IIIc 1 endometrial carcinoma diagnosed in May of 2012 was been adequately treated with surgery, chemotherapy, and radiation. A posttreatment CT scan January 14 shows 2 retroperitoneal lymph nodes  just below the level of the renal vein the largest being 10 mm in short axis. My suspicion for these lesions was well and has continued to not be an issue. However most recent concern has been of some disease in the area of the right hip. The patient and her family were told by Dr. Gilman Buttner that this is not consistent with metastatic disease. Using orthopedist tomorrow. I discussed with them that while this is unlikely consistent with metastatic disease that metastatic disease cannot be completely excluded. I would recommend close followup and no more than 3 months of your bone scan or MRI for surveillance. In the interim  I have asked pathology to check estrogen receptor and progesterone receptor status on her tumor should we have to consider additional treatment. She will followup with Dr. Gilman Buttner and she will be scheduled to return to see me in 6 months.  I will follow up on the results of her pap smear. Mry Lamia A., MD 06/01/2011, 1:11 PM

## 2011-12-05 ENCOUNTER — Telehealth: Payer: Self-pay | Admitting: Gynecologic Oncology

## 2011-12-05 NOTE — Telephone Encounter (Signed)
Pt notified about pap results: negative.  No questions or concerns voiced. 

## 2012-05-29 ENCOUNTER — Ambulatory Visit: Payer: Medicare Other | Admitting: Gynecologic Oncology

## 2012-06-04 ENCOUNTER — Other Ambulatory Visit (HOSPITAL_COMMUNITY)
Admission: RE | Admit: 2012-06-04 | Discharge: 2012-06-04 | Disposition: A | Discharge status: Home / Self Care | Payer: Medicare Other | Source: Ambulatory Visit | Attending: Gynecologic Oncology | Admitting: Gynecologic Oncology

## 2012-06-04 ENCOUNTER — Encounter: Payer: Self-pay | Admitting: Gynecologic Oncology

## 2012-06-04 ENCOUNTER — Ambulatory Visit: Payer: Medicare Other | Attending: Gynecologic Oncology | Admitting: Gynecologic Oncology

## 2012-06-04 VITALS — BP 134/62 | HR 68 | Temp 98.6°F | Resp 20 | Ht 62.6 in | Wt 183.8 lb

## 2012-06-04 DIAGNOSIS — I10 Essential (primary) hypertension: Secondary | ICD-10-CM | POA: Insufficient documentation

## 2012-06-04 DIAGNOSIS — Z09 Encounter for follow-up examination after completed treatment for conditions other than malignant neoplasm: Secondary | ICD-10-CM | POA: Insufficient documentation

## 2012-06-04 DIAGNOSIS — Z9221 Personal history of antineoplastic chemotherapy: Secondary | ICD-10-CM | POA: Insufficient documentation

## 2012-06-04 DIAGNOSIS — Z7982 Long term (current) use of aspirin: Secondary | ICD-10-CM | POA: Insufficient documentation

## 2012-06-04 DIAGNOSIS — Z90710 Acquired absence of both cervix and uterus: Secondary | ICD-10-CM | POA: Insufficient documentation

## 2012-06-04 DIAGNOSIS — E785 Hyperlipidemia, unspecified: Secondary | ICD-10-CM | POA: Insufficient documentation

## 2012-06-04 DIAGNOSIS — Z9079 Acquired absence of other genital organ(s): Secondary | ICD-10-CM | POA: Insufficient documentation

## 2012-06-04 DIAGNOSIS — Z79899 Other long term (current) drug therapy: Secondary | ICD-10-CM | POA: Insufficient documentation

## 2012-06-04 DIAGNOSIS — Z124 Encounter for screening for malignant neoplasm of cervix: Secondary | ICD-10-CM | POA: Insufficient documentation

## 2012-06-04 DIAGNOSIS — C549 Malignant neoplasm of corpus uteri, unspecified: Secondary | ICD-10-CM | POA: Insufficient documentation

## 2012-06-04 NOTE — Patient Instructions (Signed)
Return to clinic in 6 months and follow up with Dr. Gilman Buttner as scheduled.

## 2012-06-04 NOTE — Progress Notes (Signed)
Consult Note: Gyn-Onc  [name] 72 y.o. female  CC:  Chief Complaint  Patient presents with  . Endometrial adenocarcinoma    Follow up    HPI: HISTORY OF PRESENT ILLNESS: This is a 72 year old diagnosed by Dr. Oliva Taylor with a grade 1 endometrial carcinoma. On Aug 16, 2010, she underwent a robotic hysterectomy, BSO, bilateral pelvic and paraaortic lymph node dissection. Final pathologic findings were notable for metastatic disease and bilateral pelvic lymph nodes. No paraaortic metastatic adenopathy was appreciated. Tumor was noted to extend 1.8 cm  where the myometrium was 2.4 cm and there was lymphovascular space involvement. 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   Because of her pathology she underwent chemotherapy with 3 cycles of paclitaxel and carboplatin followed by pelvic radiation in an additional 3 cycles of paclitaxel and carboplatin. Her last cycle of chemotherapy was 03/27/2011. She was hospitalized with neutropenia and anemia on your cycles #2 or 3 and then cycle #4. She received a blood transfusion with one of her chemotherapy cycles. She had a CT scan of the abdomen and pelvis on April 24, 2011 for posttreatment surveillance. There was a small less than 1 cm fluid cyst posterior right hepatic lobe. There is no liver masses. There was no pancreatic, spleen, adrenal, and kidney disease. There was no evidence of hydronephrosis. There were 2 retroperitoneal lymph nodes seen in the left para-aortic space just below level of the left renal vein the largest being 10 mm in short axis. There was no other evidence of any concerning for metastatic disease. She overall feels that she tolerated her treatments rather well. Dr. Gilman Taylor did speak to them regarding her CT scan. I saw her in 2/13 and her exam and pap smear were both normal. Follow up in 8/13 also with negative exam and pap smear.  Interval History:  In October 30, 2011 she had a bone scan. Revealed focal increased activity in the right acetabulum corresponding to patchy sclerosis on the CT scan that was highly suspicious for metastatic disease. A CT scan was performed on October 24, 2011 and his bone scan was in October 30, 2011. Followup, she had an MRI of the pelvis on 11/06/2011. The MRI revealed marrow abnormalities involving the sacral ala bilaterally. The superior acetabulum right greater than left and the right proximal femur. 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 However, the underlying metastatic disease was difficult to completely exclude in short term followup with a bone scan or MRI after appropriate rest was suggested.   She had a followup head MRI on 04/25/2012. It reveals that the edema and enhancement in the right iliac bone at the superior aspect of the right acetabulum and in the right femoral head was markedly diminished. In the impression was of an improving stress reaction/stress fracture of the right acetabulum and right femoral head. The findings were felt to not represent metastatic disease. The patient is a bit unclear regarding which medications she is currently taking. She was taking Boniva and Fosamax at one point she states that she is no longer taking Fosamax that may be taking calcitonin nasal spray and is also on 50,000 units of vitamin D. There is no other change in the patient's medical history with the family history. She is up-to-date on her mammograms.   Review of Systems  [name] at this visit denies any vaginal bleeding. Denies abdominal pain,  nausea, vomiting, fever, chills. 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 She had some neuropathy her hands but that has subsequently resolved. . She denies any chest pain, SOB, Headaches, unintentional weight, loss weight gain, abdominal pain with the exception of occasional sticking "pin type" pain in her  midabdomen it happens about once a month. It never wakes her up at night. No significant change of bowel or bladder habits. She has lost about 5 pounds which has been intentional. 10 point review of systems is negative.    Current Meds:  Outpatient Encounter Prescriptions as of 06/04/2012  Medication Sig Dispense Refill  . alendronate (FOSAMAX) 70 MG tablet Take 70 mg by mouth every 7 (seven) days. Take with a full glass of water on an empty stomach.      . ALPRAZolam (XANAX) 0.5 MG tablet Take 0.5 mg by mouth as needed.       Marland Kitchen aspirin 81 MG tablet Take 81 mg by mouth daily.      . Calcium Carb-Cholecalciferol 1000-800 MG-UNIT TABS Take by mouth 2 (two) times daily.      Marland Kitchen escitalopram (LEXAPRO) 10 MG tablet Take 10 mg by mouth daily.      . folic acid (FOLVITE) 1 MG tablet Take 1 mg by mouth daily.      Marland Kitchen ibandronate (BONIVA) 150 MG tablet Take 150 mg by mouth every 30 (thirty) days. Take in the morning with a full glass of water, on an empty stomach, and do not take anything else by mouth or lie down for the next 30 min.      Marland Kitchen lisinopril-hydrochlorothiazide (PRINZIDE,ZESTORETIC) 20-12.5 MG per tablet Take 1 tablet by mouth as needed (to keep systolic 140 or less).       . pravastatin (PRAVACHOL) 20 MG tablet Take 20 mg by mouth daily.      Marland Kitchen senna-docusate (SENOKOT-S) 8.6-50 MG per tablet Take 1 tablet by mouth as needed.      . vitamin B-12 (CYANOCOBALAMIN) 1000 MCG tablet Take 1,000 mcg by mouth daily.      . Vitamin D, Ergocalciferol, (DRISDOL) 50000 UNITS CAPS Take 50,000 Units by mouth every 7 (seven) days.      . [DISCONTINUED] calcium carbonate (OS-CAL) 600 MG TABS Take 600 mg by mouth daily.      . [DISCONTINUED] LACTULOSE PO Take 10 mg by mouth as needed.        No facility-administered encounter medications on file as of 06/04/2012.    Allergy: No Known Allergies  Social Hx:   History   Social History  . Marital Status: Married    Spouse Name: N/A    Number of Children:  N/A  . Years of Education: N/A   Occupational History  . Not on file.   Social History Main Topics  . Smoking status: Never Smoker   . Smokeless tobacco: Not on file  . Alcohol Use: Yes     Comment: Occas.  . Drug Use: Not on file  . Sexually Active: Not on file   Other Topics Concern  . Not on file   Social History Narrative  . No narrative on file    Past Surgical Hx:  Past Surgical History  Procedure Laterality Date  . Cholecystectomy  2010  . Endometrial biopsy  06/21/10  . Endometrial cancer staging  08/2010  . Abdominal hysterectomy  08/16/2010    Robotic hyst, BSO, LND    Past Medical Hx:  Past Medical History  Diagnosis Date  .  Hypertension   . Hyperlipidemia   . Endometrial adenocarcinoma 08/2010    Stage IIIC1 grade 1    Family Hx: No family history on file.  Vitals:  Blood pressure 134/62, pulse 68, temperature 98.6 F (37 C), temperature source Oral, resp. rate 20, height 5' 2.6" (1.59 m), weight 183 lb 12.8 oz (83.371 kg).  Physical Exam: Well-nourished well-developed female in no acute distress.   Neck: Supple no lymphadenopathy no thyromegaly.   Lungs: Clear to auscultation bilaterally. Cardiovascular: Regular rate and rhythm.   Abdomen: Mildly obese. Soft, nontender, nondistended. There is no evidence of any incisional hernias. There are no palpable masses. Exam is limited by habitus.   Groins: No lymphadenopathy.   Extremities: Trace pedal edema equal bilaterally. 2+ pulses.   Pelvic: Normal external female genitalia. Vagina is markedly atrophic. The vaginal cuff is visualized. There are no visible lesions. ThinPrep Pap was submitted without difficulty. Bimanual examination reveals no masses or nodularity rectal confirms.   Assessment/Plan:  72 year old with stage IIIc 1 endometrial carcinoma diagnosed in May of 2012 was been adequately treated with surgery, chemotherapy, and radiation. A posttreatment CT scan April 23, 2010 shows 2  retroperitoneal lymph nodes just below the level of the renal vein the largest being 10 mm in short axis. My suspicion for these lesions was well and has continued to not be an issue. However most recent concern has been of some disease in the area of the right hip. This over time has clearly been related to stress/insufficiency and not metastatic disease. She is doing very well.   We will follow up on the results of her pap smear from today and she will return to see [place] in 6 months. She will follow up with Dr. Gilman Taylor as scheduled.   Yula Crotwell A., MD 06/04/2012, 3:17 PM

## 2012-06-12 ENCOUNTER — Telehealth: Payer: Self-pay | Admitting: *Deleted

## 2012-06-12 NOTE — Telephone Encounter (Signed)
Message left for pt with PAP results  

## 2012-06-12 NOTE — Telephone Encounter (Signed)
Patient informed of PAP results  

## 2012-11-11 IMAGING — CR DG CHEST 2V
2 series · 2 of 2 positions shown · non-contrast
Comparison: None.

CLINICAL DATA: Endometrial carcinoma.  Hypertension. Pre-op
respiratory exam.

CHEST - 2 VIEW

[w chest pa]
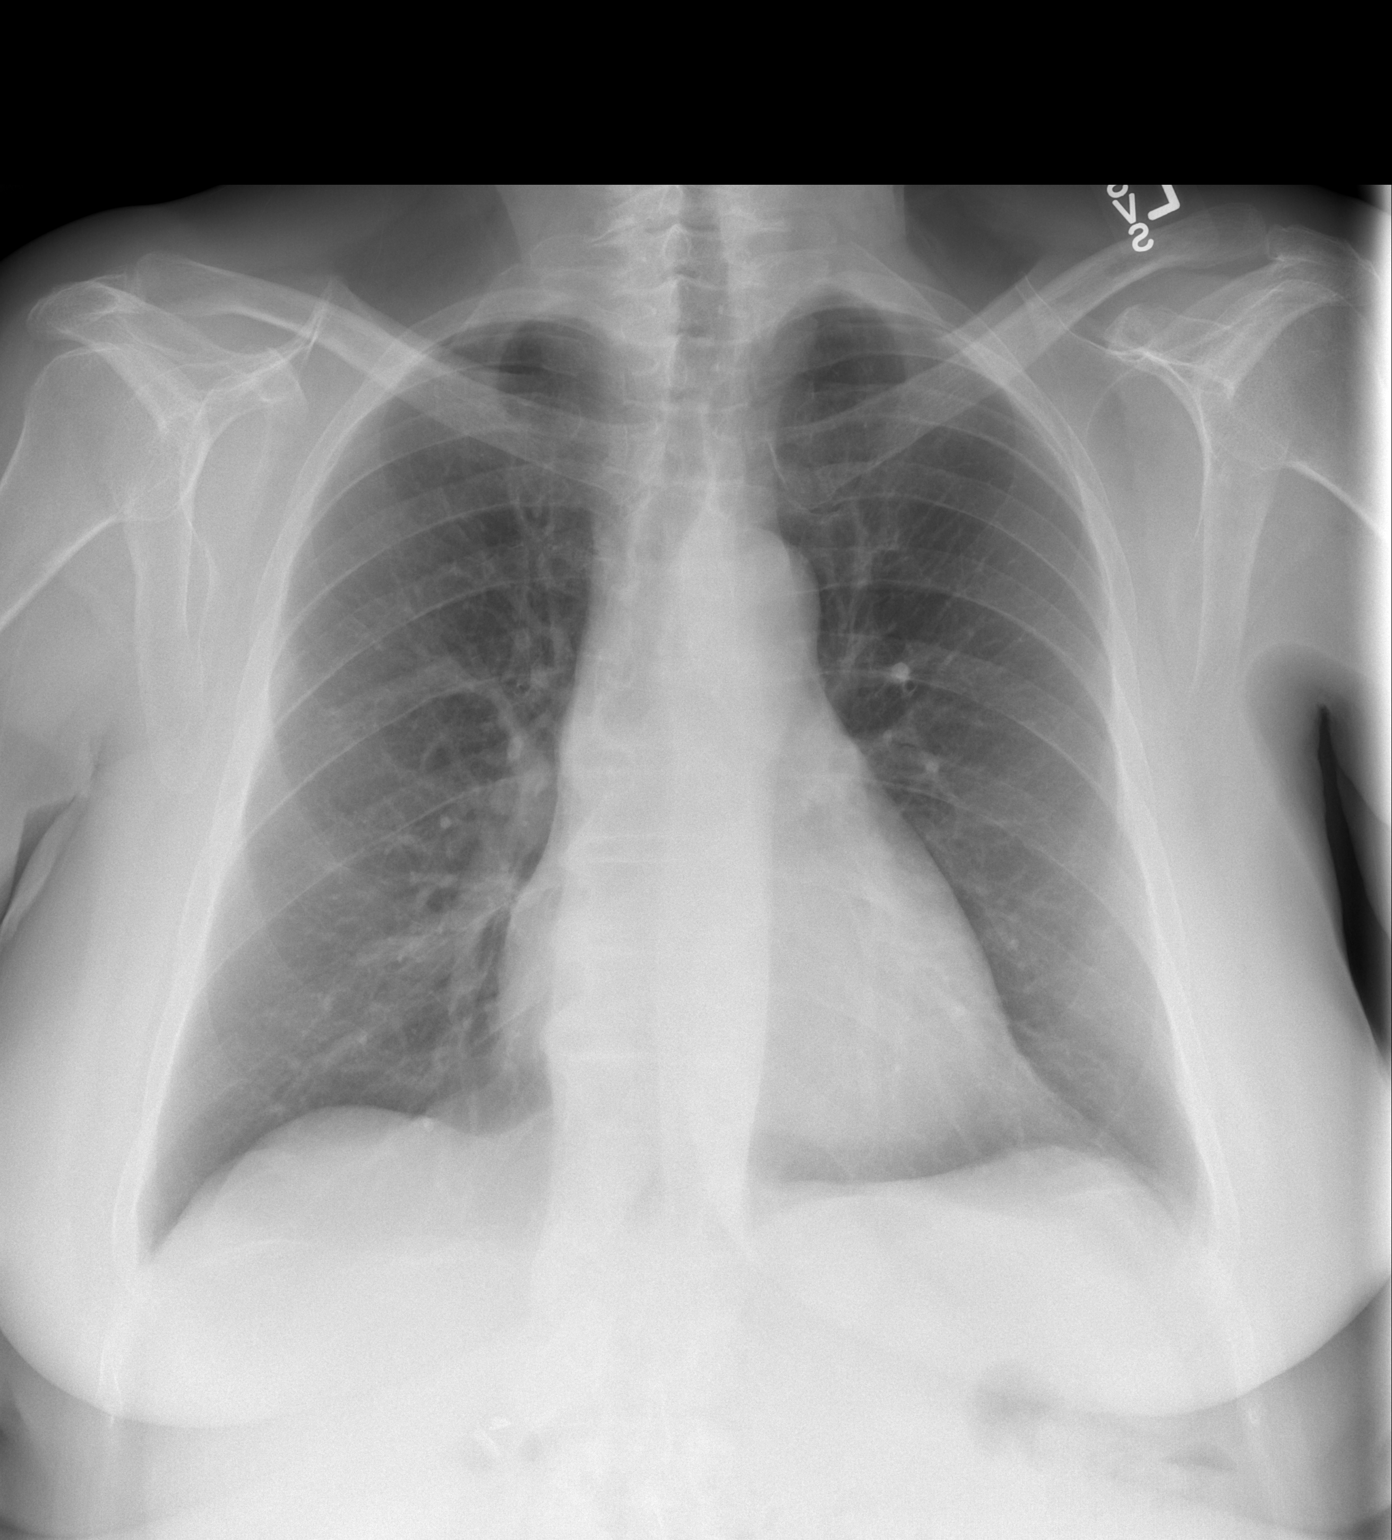

[w chest lat]
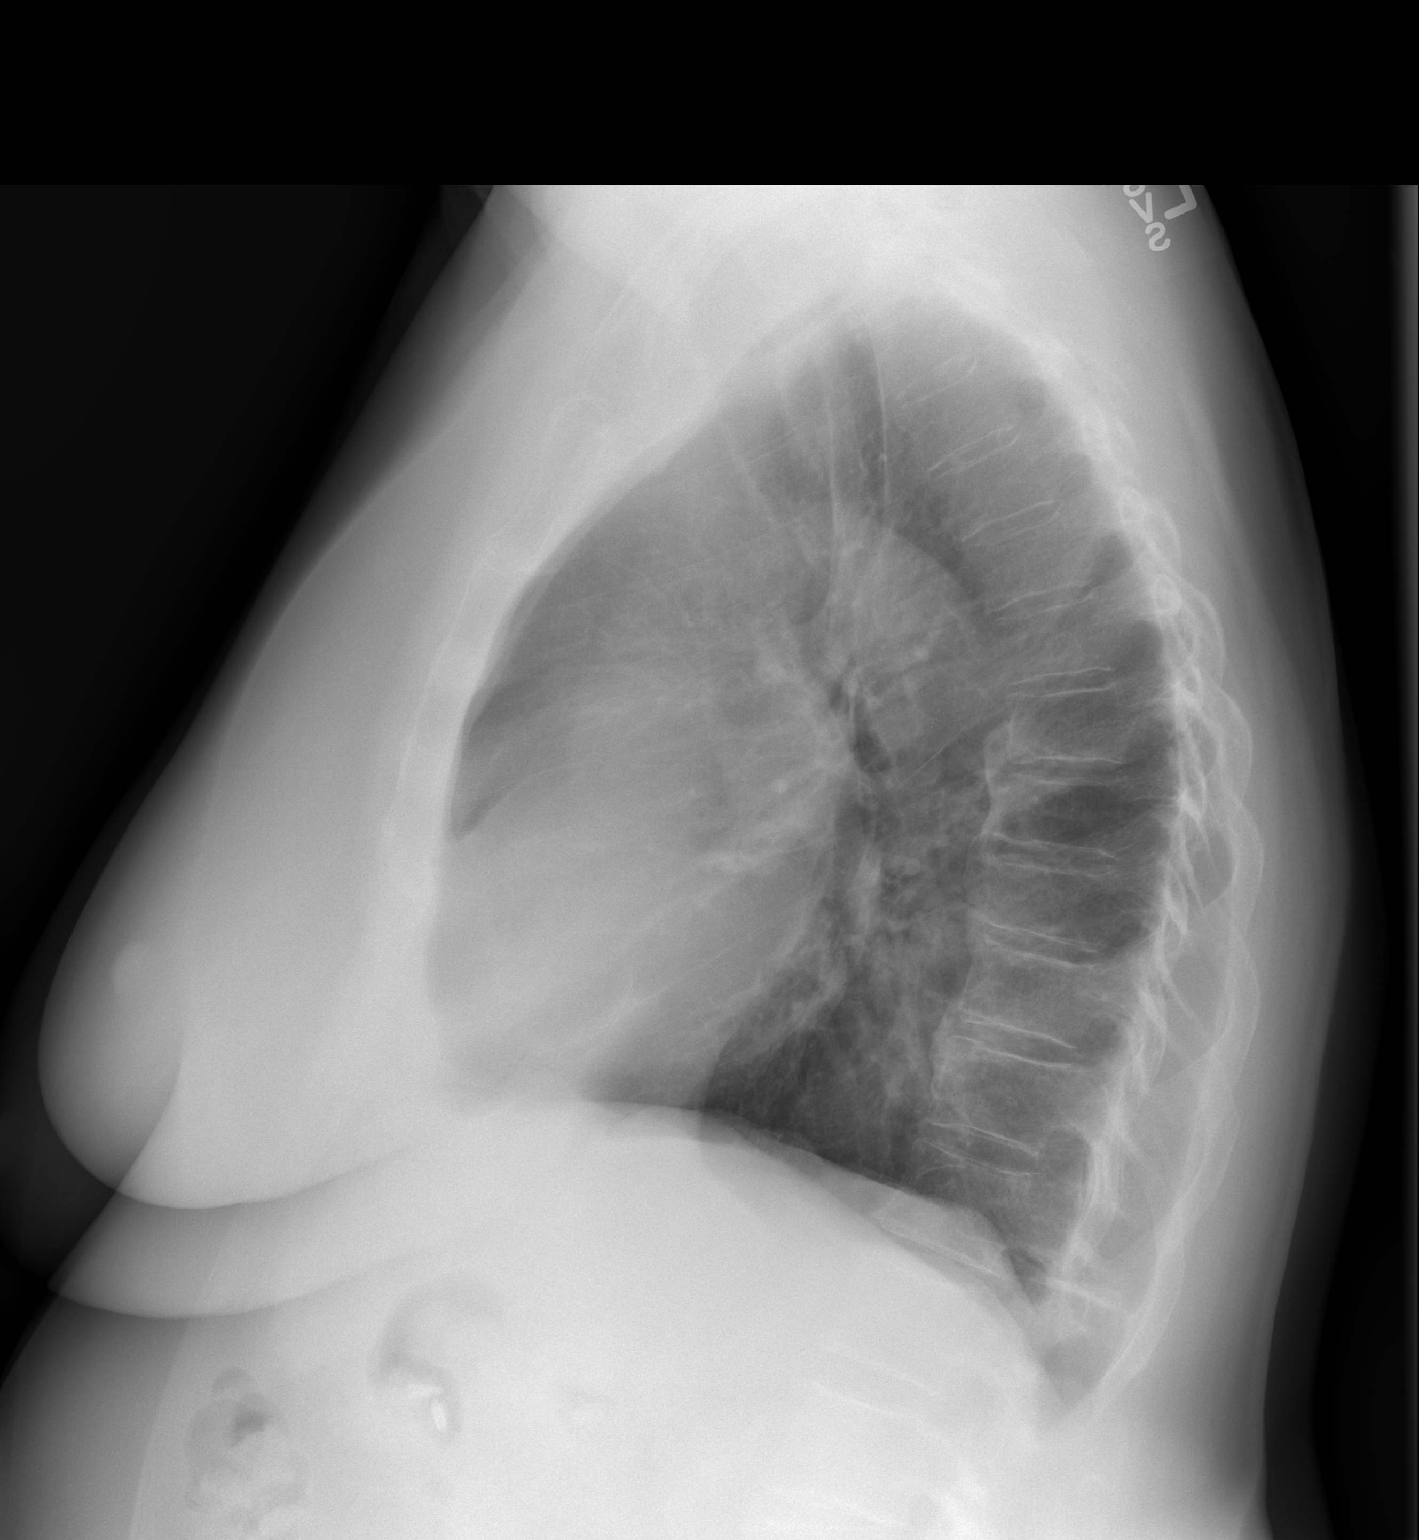

[2 of 2 positions shown; findings below may reference images not displayed]

FINDINGS: The heart size and mediastinal contours are within
normal limits.  Both lungs are clear.  The visualized skeletal
structures are unremarkable.
IMPRESSION: No active cardiopulmonary disease.

## 2012-11-21 ENCOUNTER — Other Ambulatory Visit (HOSPITAL_COMMUNITY)
Admission: RE | Admit: 2012-11-21 | Discharge: 2012-11-21 | Disposition: A | Discharge status: Home / Self Care | Payer: Medicare Other | Source: Ambulatory Visit | Attending: Gynecologic Oncology | Admitting: Gynecologic Oncology

## 2012-11-21 ENCOUNTER — Encounter: Payer: Self-pay | Admitting: Gynecologic Oncology

## 2012-11-21 ENCOUNTER — Ambulatory Visit: Payer: Medicare Other | Attending: Gynecologic Oncology | Admitting: Gynecologic Oncology

## 2012-11-21 VITALS — BP 120/68 | HR 70 | Temp 99.1°F | Resp 16 | Wt 183.6 lb

## 2012-11-21 DIAGNOSIS — C549 Malignant neoplasm of corpus uteri, unspecified: Secondary | ICD-10-CM | POA: Insufficient documentation

## 2012-11-21 DIAGNOSIS — C541 Malignant neoplasm of endometrium: Secondary | ICD-10-CM

## 2012-11-21 DIAGNOSIS — Z90710 Acquired absence of both cervix and uterus: Secondary | ICD-10-CM | POA: Insufficient documentation

## 2012-11-21 DIAGNOSIS — Z124 Encounter for screening for malignant neoplasm of cervix: Secondary | ICD-10-CM | POA: Insufficient documentation

## 2012-11-21 DIAGNOSIS — Z9079 Acquired absence of other genital organ(s): Secondary | ICD-10-CM | POA: Insufficient documentation

## 2012-11-21 DIAGNOSIS — Z79899 Other long term (current) drug therapy: Secondary | ICD-10-CM | POA: Insufficient documentation

## 2012-11-21 NOTE — Progress Notes (Signed)
Consult Note: Gyn-Onc  [name] 72 y.o. female  CC:  Chief Complaint  Patient presents with  . Endometrial adenocarcinoma    Follow up    HPI: HISTORY OF PRESENT ILLNESS: This is a 73 year old diagnosed by Dr. Oliva Bustard with a grade 1 endometrial carcinoma. On Aug 16, 2010, she underwent a robotic hysterectomy, BSO, bilateral pelvic and paraaortic lymph node dissection. Final pathologic findings were notable for metastatic disease and bilateral pelvic lymph nodes. No paraaortic metastatic adenopathy was appreciated. Tumor was noted to extend 1.8 cm  where the myometrium was 2.4 cm and there was lymphovascular space involvement. 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   Because of her pathology she underwent chemotherapy with 3 cycles of paclitaxel and carboplatin followed by pelvic radiation in an additional 3 cycles of paclitaxel and carboplatin. Her last cycle of chemotherapy was 03/27/2011. She was hospitalized with neutropenia and anemia on your cycles #2 or 3 and then cycle #4. She received a blood transfusion with one of her chemotherapy cycles. She had a CT scan of the abdomen and pelvis on April 24, 2011 for posttreatment surveillance. There was a small less than 1 cm fluid cyst posterior right hepatic lobe. There is no liver masses. There was no pancreatic, spleen, adrenal, and kidney disease. There was no evidence of hydronephrosis. There were 2 retroperitoneal lymph nodes seen in the left para-aortic space just below level of the left renal vein the largest being 10 mm in short axis. There was no other evidence of any concerning for metastatic disease. She overall feels that she tolerated her treatments rather well. Dr. Gilman Buttner did speak to them regarding her CT scan. I saw her in 2/14 and her exam and pap smear were both normal.   In October 30, 2011 she had a bone scan. Revealed focal increased activity in the right  acetabulum corresponding to patchy sclerosis on the CT scan that was highly suspicious for metastatic disease. A CT scan was performed on October 24, 2011 and his bone scan was in October 30, 2011. Followup, she had an MRI of the pelvis on 11/06/2011. The MRI revealed marrow abnormalities involving the sacral ala bilaterally. The superior acetabulum right greater than left and the right proximal femur. 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 However, the underlying metastatic disease was difficult to completely exclude in short term followup with a bone scan or MRI after appropriate rest was suggested.   She had a followup head MRI on 04/25/2012. It reveals that the edema and enhancement in the right iliac bone at the superior aspect of the right acetabulum and in the right femoral head was markedly diminished. In the impression was of an improving stress reaction/stress fracture of the right acetabulum and right femoral head. The findings were felt to not represent metastatic disease.   Interval History:  She had a "negative CT" scan per her daughter on 11/01/12.  The report was available after they left today that when compared to CT from 07/31/12 revealed an unchanged 4.6x2.8 cm necrotic lymph nodes. This compared in size to a 4.6x3.1cm node on 07/31/12.  They stated that Dr. Gilman Buttner stated that the plan would be a repeat scan in 3 months, which is very reasonable.  Review of Systems  [name] denies any vaginal bleeding. Denies abdominal pain, nausea, vomiting, fever, chills. 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 She had some neuropathy her hands but that has subsequently resolved. . She denies any chest pain, SOB, headaches, unintentional weight, loss weight gain, abdominal pain with the exception of occasional sticking "pin type" pain in her midabdomen it happens about once a month. It never wakes her up at night.  No significant change of bowel or bladder habits. She sees her primary MD in a few weeks to get her Vit D levels checked.  Her MMG was > 1 year ago and she will discuss this with Dr. Samuel Germany.   Current Meds:  Outpatient Encounter Prescriptions as of 11/21/2012  Medication Sig Dispense Refill  . alendronate (FOSAMAX) 70 MG tablet Take 70 mg by mouth every 7 (seven) days. Take with a full glass of water on an empty stomach.      . ALPRAZolam (XANAX) 0.5 MG tablet Take 0.5 mg by mouth as needed.       Marland Kitchen aspirin 81 MG tablet Take 81 mg by mouth daily.      . Calcium Carb-Cholecalciferol 1000-800 MG-UNIT TABS Take by mouth 2 (two) times daily.      Marland Kitchen escitalopram (LEXAPRO) 10 MG tablet Take 10 mg by mouth daily.      . folic acid (FOLVITE) 1 MG tablet Take 1 mg by mouth daily.      Marland Kitchen ibandronate (BONIVA) 150 MG tablet Take 150 mg by mouth every 30 (thirty) days. Take in the morning with a full glass of water, on an empty stomach, and do not take anything else by mouth or lie down for the next 30 min.      Marland Kitchen lisinopril-hydrochlorothiazide (PRINZIDE,ZESTORETIC) 20-12.5 MG per tablet Take 1 tablet by mouth as needed (to keep systolic 140 or less).       . pravastatin (PRAVACHOL) 20 MG tablet Take 20 mg by mouth daily.      Marland Kitchen senna-docusate (SENOKOT-S) 8.6-50 MG per tablet Take 1 tablet by mouth as needed.      . tamoxifen (NOLVADEX) 20 MG tablet Take 20 mg by mouth daily.      . vitamin B-12 (CYANOCOBALAMIN) 1000 MCG tablet Take 1,000 mcg by mouth daily.      . Vitamin D, Ergocalciferol, (DRISDOL) 50000 UNITS CAPS Take 50,000 Units by mouth every 7 (seven) days.       No facility-administered encounter medications on file as of 11/21/2012.    Allergy: No Known Allergies  Social Hx:   History   Social History  . Marital Status: Married    Spouse Name: N/A    Number of Children: N/A  . Years of Education: N/A   Occupational History  . Not on file.   Social History Main Topics  . Smoking  status: Never Smoker   . Smokeless tobacco: Not on file  . Alcohol Use: Yes     Comment: Occas.  . Drug Use: Not on file  . Sexual Activity: Not on file   Other Topics Concern  . Not on file   Social History Narrative  . No narrative on file    Past Surgical Hx:  Past Surgical History  Procedure Laterality Date  . Cholecystectomy  2010  . Endometrial biopsy  06/21/10  . Endometrial cancer staging  08/2010  . Abdominal hysterectomy  08/16/2010    Robotic hyst, BSO, LND    Past Medical Hx:  Past Medical History  Diagnosis Date  . Hypertension   . Hyperlipidemia   . Endometrial adenocarcinoma 08/2010  Stage IIIC1 grade 1    Family Hx: No family history on file.  Vitals:  Blood pressure 120/68, pulse 70, temperature 99.1 F (37.3 C), temperature source Oral, resp. rate 16, weight 183 lb 9.6 oz (83.28 kg).  Physical Exam: Well-nourished well-developed female in no acute distress.   Neck: Supple no lymphadenopathy no thyromegaly.   Lungs: Clear to auscultation bilaterally. Cardiovascular: Regular rate and rhythm.   Abdomen: Mildly obese. Soft, nontender, nondistended. There is no evidence of any incisional hernias. There are no palpable masses. Exam is limited by habitus.   Groins: No lymphadenopathy.   Extremities: Trace pedal edema equal bilaterally. 2+ pulses.   Pelvic: Normal external female genitalia. Vagina is markedly atrophic. The vaginal cuff is visualized. There are no visible lesions. ThinPrep Pap was submitted without difficulty. Bimanual examination reveals no masses or nodularity rectal confirms.   Assessment/Plan:  72 year old with stage IIIc 1 endometrial carcinoma diagnosed in May of 2012 was been adequately treated with surgery, chemotherapy, and radiation. A posttreatment CT scan April 23, 2010 shows 2 retroperitoneal lymph nodes just below the level of the renal vein the largest being 10 mm in short axis. My suspicion for these lesions was low and  has continued to not be an issue.  However, the nodes now appear necrotic and while unchanged from April to July of this year do require close follow up and she is appropriately scheduled. She is doing very well.   We will follow up on the results of her pap smear from today and she will return to see [place] in 6 months. She will follow up with Dr. Gilman Buttner as scheduled.   Tanairi Cypert A., MD 11/21/2012, 7:55 PM

## 2012-11-21 NOTE — Patient Instructions (Signed)
Plan to follow up in six months or sooner if needed.  We will contact you with the results of your pap smear.

## 2012-11-28 ENCOUNTER — Telehealth: Payer: Self-pay | Admitting: Gynecologic Oncology

## 2012-11-28 NOTE — Telephone Encounter (Signed)
Message left for patient with pap smear results: negative.  Instructed to call for any questions or concerns.  

## 2013-05-13 ENCOUNTER — Telehealth: Payer: Self-pay | Admitting: *Deleted

## 2013-05-13 NOTE — Telephone Encounter (Signed)
Pt called to cancel appt on 2/18- pt does not want to r/s at this time.

## 2013-05-28 ENCOUNTER — Ambulatory Visit: Payer: Medicare Other | Admitting: Gynecologic Oncology

## 2013-08-08 DEATH — deceased
